# Patient Record
Sex: Male | Born: 2000 | Race: White | Hispanic: No | Marital: Single | Smoking: Never smoker
Health system: Southern US, Community
[De-identification: ages and names within clinical notes are randomized; demographics above are authoritative.]

## PROBLEM LIST (undated history)

## (undated) DIAGNOSIS — F909 Attention-deficit hyperactivity disorder, unspecified type: Secondary | ICD-10-CM

## (undated) HISTORY — PX: ANKLE SURGERY: SHX546

## (undated) HISTORY — PX: INGUINAL HERNIA REPAIR: SHX194

---
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
## 2022-12-05 ENCOUNTER — Ambulatory Visit
Admission: EM | Admit: 2022-12-05 | Discharge: 2022-12-05 | Disposition: A | Discharge status: Home / Self Care | Payer: Medicaid Other

## 2022-12-05 ENCOUNTER — Encounter: Payer: Self-pay | Admitting: Emergency Medicine

## 2022-12-05 DIAGNOSIS — J0100 Acute maxillary sinusitis, unspecified: Secondary | ICD-10-CM

## 2022-12-05 DIAGNOSIS — L0100 Impetigo, unspecified: Secondary | ICD-10-CM

## 2022-12-05 MED ORDER — MUPIROCIN 2 % EX OINT: 1.0000 | TOPICAL_OINTMENT | Freq: Two times a day (BID) | CUTANEOUS | 0 refills | Status: DC

## 2022-12-05 MED ORDER — AMOXICILLIN 875 MG PO TABS: 875.0000 mg | ORAL_TABLET | Freq: Two times a day (BID) | ORAL | 0 refills | Status: AC

## 2022-12-05 NOTE — ED Provider Notes (Signed)
[name]    CSN: [id number] Arrival date & time: 12/05/22  1715      History   Chief Complaint Chief Complaint  Patient presents with   Nasal Congestion   Facial Pain    HPI Cole Jordan is a 22 y.o. male.  Patient presents with 1 month history of sinus pressure, congestion, ear pain, headache, intermittent dizziness.  His symptoms have gotten worse in the last couple of weeks.  He feels that he has a sinus infection.  He also has a sore in the inside of his left nare.  He has been treating his symptoms with OTC cold medicine without relief.  No fever, cough, shortness of breath, or other symptoms.  The history is provided by the patient and medical records.    Past Medical History:  Diagnosis Date   ADHD     There are no problems to display for this patient.   Past Surgical History:  Procedure Laterality Date   INGUINAL HERNIA REPAIR         Home Medications    Prior to Admission medications   Medication Sig Start Date End Date Taking? Authorizing Provider  amoxicillin (AMOXIL) 875 MG tablet Take 1 tablet (875 mg total) by mouth 2 (two) times daily for 10 days. 12/05/22 12/15/22 Yes Mickie Bail, NP  mupirocin ointment (BACTROBAN) 2 % Apply 1 Application topically 2 (two) times daily. 12/05/22  Yes Mickie Bail, NP  traZODone (DESYREL) 50 MG tablet Take 50 mg by mouth at bedtime. 12/03/22  Yes [provider]  cloNIDine (CATAPRES) 0.1 MG tablet TAKE 1 TABLET BY MOUTH EVERY NIGHT AT BEDTIME AS DIRECTED 08/12/21   [provider]  methylphenidate 36 MG PO CR tablet Take 36 mg by mouth daily.    [provider]  methylphenidate 36 MG PO CR tablet Take 36 mg by mouth daily.    [provider]  sertraline (ZOLOFT) 25 MG tablet  10/17/17   [provider]    Family History Family History  Problem Relation Age of Onset   Healthy Mother    Healthy Father     Social History Social History   Tobacco Use    Smoking status: Never   Smokeless tobacco: Never  Vaping Use   Vaping status: Never Used  Substance Use Topics   Alcohol use: No   Drug use: No     Allergies   Patient has no known allergies.   Review of Systems Review of Systems  Constitutional:  Negative for chills and fever.  HENT:  Positive for congestion, ear pain, rhinorrhea and sinus pressure. Negative for sore throat.   Respiratory:  Negative for cough and shortness of breath.   Cardiovascular:  Negative for chest pain and palpitations.  Neurological:  Positive for dizziness and headaches. Negative for syncope, weakness and numbness.     Physical Exam Triage Vital Signs ED Triage Vitals  Encounter Vitals Group     BP --      Systolic BP Percentile --      Diastolic BP Percentile --      Pulse --      Resp --      Temp --      Temp src --      SpO2 --      Weight 12/05/22 1839 150 lb (68 kg)     Height --      Head Circumference --      Peak Flow --  Pain Score 12/05/22 1838 4     Pain Loc --      Pain Education --      Exclude from Growth Chart --    No data found.  Updated Vital Signs BP 125/70   Pulse 72   Temp 98.1 F (36.7 C) (Oral)   Resp 18   Wt 150 lb (68 kg)   SpO2 98%   Visual Acuity Right Eye Distance:   Left Eye Distance:   Bilateral Distance:    Right Eye Near:   Left Eye Near:    Bilateral Near:     Physical Exam Constitutional:      General: He is not in acute distress. HENT:     Right Ear: Tympanic membrane normal.     Left Ear: Tympanic membrane normal.     Nose: Congestion and rhinorrhea present.     Mouth/Throat:     Mouth: Mucous membranes are moist.     Pharynx: Oropharynx is clear.  Cardiovascular:     Rate and Rhythm: Normal rate and regular rhythm.     Heart sounds: Normal heart sounds.  Pulmonary:     Effort: Pulmonary effort is normal. No respiratory distress.     Breath sounds: Normal breath sounds.  Skin:    General: Skin is warm and dry.      Findings: Lesion present.     Comments: Honey-colored crusted lesion just inside left nare; no drainage.  Neurological:     Mental Status: He is alert.      UC Treatments / Results  Labs (all labs ordered are listed, but only abnormal results are displayed) Labs Reviewed - No data to display  EKG   Radiology No results found.  Procedures Procedures (including critical care time)  Medications Ordered in UC Medications - No data to display  Initial Impression / Assessment and Plan / UC Course  I have reviewed the triage vital signs and the nursing notes.  Pertinent labs & imaging results that were available during my care of the patient were reviewed by me and considered in my medical decision making (see chart for details).    Acute sinusitis, impetigo.  Patient is a symptomatic for a month.  Afebrile and VSS.  Treating today with amoxicillin.  Treating nasal sore with mupirocin ointment.  Education provided on sinus infection and impetigo.  Instructed patient to follow-up with his PCP if he is not improving.  He agrees to plan of care.  Final Clinical Impressions(s) / UC Diagnoses   Final diagnoses:  Acute non-recurrent maxillary sinusitis  Impetigo     Discharge Instructions      Take the amoxicillin and use the mupirocin ointment as directed.  Follow-up with your primary care provider if your symptoms are not improving.      ED Prescriptions     Medication Sig Dispense Auth. Provider   amoxicillin (AMOXIL) 875 MG tablet Take 1 tablet (875 mg total) by mouth 2 (two) times daily for 10 days. 20 tablet Mickie Bail, NP   mupirocin ointment (BACTROBAN) 2 % Apply 1 Application topically 2 (two) times daily. 22 g Mickie Bail, NP      PDMP not reviewed this encounter.   Mickie Bail, NP 12/05/22 1900

## 2022-12-05 NOTE — ED Triage Notes (Signed)
Patient in office today chief complaint congestion, Ha and dizziness x 23mo greenish nasal mucus facial pain and ear pain  OTC: Cold and Cough Medicine  Denies: Fever, N/V

## 2022-12-05 NOTE — Discharge Instructions (Addendum)
Take the amoxicillin and use the mupirocin ointment as directed.  Follow-up with your primary care provider if your symptoms are not improving.

## 2023-02-01 ENCOUNTER — Encounter: Payer: Self-pay | Admitting: Family Medicine

## 2023-02-01 ENCOUNTER — Ambulatory Visit (INDEPENDENT_AMBULATORY_CARE_PROVIDER_SITE_OTHER): Payer: Medicaid Other | Admitting: Family Medicine

## 2023-02-01 VITALS — BP 120/78 | HR 74 | Ht 65.0 in | Wt 154.8 lb

## 2023-02-01 DIAGNOSIS — Z13220 Encounter for screening for lipoid disorders: Secondary | ICD-10-CM

## 2023-02-01 DIAGNOSIS — Z131 Encounter for screening for diabetes mellitus: Secondary | ICD-10-CM

## 2023-02-01 DIAGNOSIS — R5382 Chronic fatigue, unspecified: Secondary | ICD-10-CM | POA: Diagnosis not present

## 2023-02-01 DIAGNOSIS — Z0000 Encounter for general adult medical examination without abnormal findings: Secondary | ICD-10-CM

## 2023-02-01 DIAGNOSIS — E559 Vitamin D deficiency, unspecified: Secondary | ICD-10-CM

## 2023-02-01 DIAGNOSIS — G478 Other sleep disorders: Secondary | ICD-10-CM

## 2023-02-01 DIAGNOSIS — N529 Male erectile dysfunction, unspecified: Secondary | ICD-10-CM

## 2023-02-01 DIAGNOSIS — M25571 Pain in right ankle and joints of right foot: Secondary | ICD-10-CM

## 2023-02-01 DIAGNOSIS — F909 Attention-deficit hyperactivity disorder, unspecified type: Secondary | ICD-10-CM

## 2023-02-01 DIAGNOSIS — Z113 Encounter for screening for infections with a predominantly sexual mode of transmission: Secondary | ICD-10-CM

## 2023-02-01 DIAGNOSIS — F418 Other specified anxiety disorders: Secondary | ICD-10-CM

## 2023-02-01 DIAGNOSIS — Z114 Encounter for screening for human immunodeficiency virus [HIV]: Secondary | ICD-10-CM

## 2023-02-01 DIAGNOSIS — G4733 Obstructive sleep apnea (adult) (pediatric): Secondary | ICD-10-CM | POA: Insufficient documentation

## 2023-02-01 DIAGNOSIS — Z1159 Encounter for screening for other viral diseases: Secondary | ICD-10-CM

## 2023-02-01 DIAGNOSIS — G8929 Other chronic pain: Secondary | ICD-10-CM

## 2023-02-01 MED ORDER — SILDENAFIL CITRATE 100 MG PO TABS: 50.0000 mg | ORAL_TABLET | ORAL | 11 refills | Status: DC | PRN

## 2023-02-01 NOTE — Progress Notes (Signed)
 Primary Care / Sports Medicine Office Visit  Patient Information:  Patient ID: Cole Jordan, male DOB: October 11, 2000 Age: 23 y.o. MRN: [id number]   Cole Jordan is a pleasant 23 y.o. male presenting with the following:  Chief Complaint  Patient presents with   Establish Care    Patient presents today to establish care and talk about his low energy level. He has noticed low energy for the last year. He has been eating well and exercise but it has not helped. He would like to get some labs to see if he is deficient in anything.     Vitals:   02/01/23 0819  BP: 120/78  Pulse: 74  SpO2: 99%   Vitals:   02/01/23 0819  Weight: 154 lb 12.8 oz (70.2 kg)  Height: 5' 5 (1.651 m)   Body mass index is 25.76 kg/m.  No results found.   Independent interpretation of notes and tests performed by another provider:   None  Procedures performed:   None  Pertinent History, Exam, Impression, and Recommendations:   Physical Exam General: Well Developed, well nourished, and in no acute distress.  Skin: Warm and dry, no rashes noted.  Cardiac: Regular rate and rhythm, no murmurs, rubs, or gallops. Respiratory: Clear to auscultation bilaterally. Not using accessory muscles, speaking in full sentences.   Problem List Items Addressed This Visit       Nervous and Auditory   Non-restorative sleep   The patient also reports issues with sleep, initially attributing it to a past nasal septum surgery, but notes that sleep has improved in the past couple of months. Despite improved sleep, the patient continues to feel fatigued. He does report snoring.  -Order home sleep study to evaluate for sleep apnea.        Other   ADHD   Chronic fatigue - Primary   Presents with a chief complaint of low energy that has been progressively worsening over the past year or two. The patient reports feeling exhausted after work and sleeping from 6 PM to 7 AM, but still waking up feeling unwell.    Chronic Fatigue Ongoing for the past year, worsening over time. Multiple potential contributing factors including sleep apnea, low testosterone , and mood disorders. -Order comprehensive lab work including thyroid function, blood count, and glucose levels. -Order home sleep study to evaluate for sleep apnea. -Consider further management through psychiatry for potential adjustment of mood disorder treatment.      Relevant Orders   CBC   Comprehensive metabolic panel   TSH   Chronic pain of right ankle   Chronic Ankle Pain -right History of three ankle surgeries in 2023 and 2024. Currently managed with Meloxicam 15mg  daily. -Consider discussing potential addition of Cymbalta (duloxetine) with psychiatry provider to address both mood and chronic pain.      Relevant Medications   meloxicam (MOBIC) 15 MG tablet   buPROPion (WELLBUTRIN XL) 150 MG 24 hr tablet   Depression with anxiety   Relevant Medications   buPROPion (WELLBUTRIN XL) 150 MG 24 hr tablet   Erectile dysfunction   The patient also reports sexual dysfunction. The patient notes no decrease in libido but finding difficulty maintaining an erection, which has been concurrent with the decrease in energy.  Erectile Dysfunction Difficulty maintaining erection despite preserved libido. Possible contributing factors include low testosterone , mood disorders, and potential sleep apnea. -Order Sildenafil  (Viagra ) 50-100 mg PRN to improve erectile function. -Consider further evaluation depending on results of lab  work and sleep study.      Relevant Medications   sildenafil  (VIAGRA ) 100 MG tablet   Other Visit Diagnoses       Healthcare maintenance       Relevant Orders   CBC   Comprehensive metabolic panel   Hemoglobin A1c   Hepatitis C antibody   HIV Antibody (routine testing w rflx)   Lipid panel   TSH   VITAMIN D  25 Hydroxy (Vit-D Deficiency, Fractures)   RPR   GC/Chlamydia Probe Amp   HSV-2 Ab, IgG     Routine  screening for STI (sexually transmitted infection)       Relevant Orders   Hepatitis C antibody   HIV Antibody (routine testing w rflx)   RPR   GC/Chlamydia Probe Amp   HSV-2 Ab, IgG     Screening for HIV (human immunodeficiency virus)       Relevant Orders   HIV Antibody (routine testing w rflx)     Need for hepatitis C screening test       Relevant Orders   Hepatitis C antibody     Screening for lipoid disorders       Relevant Orders   Lipid panel     Vitamin D  deficiency       Relevant Orders   VITAMIN D  25 Hydroxy (Vit-D Deficiency, Fractures)     Screening for diabetes mellitus       Relevant Orders   VITAMIN D  25 Hydroxy (Vit-D Deficiency, Fractures)      I provided a total time of 64 minutes including both face-to-face and non-face-to-face time on 02/01/2023 inclusive of time utilized for medical chart review, information gathering, care coordination with staff, and documentation completion.   Orders & Medications Medications:  Meds ordered this encounter  Medications   sildenafil  (VIAGRA ) 100 MG tablet    Sig: Take 0.5-1 tablets (50-100 mg total) by mouth as needed for erectile dysfunction.    Dispense:  10 tablet    Refill:  11   Orders Placed This Encounter  Procedures   GC/Chlamydia Probe Amp   CBC   Comprehensive metabolic panel   Hemoglobin A1c   Hepatitis C antibody   HIV Antibody (routine testing w rflx)   Lipid panel   TSH   VITAMIN D  25 Hydroxy (Vit-D Deficiency, Fractures)   RPR   HSV-2 Ab, IgG     No follow-ups on file.     Selinda JINNY Ku, MD, [facility]   Primary Care Sports Medicine Primary Care and Sports Medicine at [facility]

## 2023-02-01 NOTE — Assessment & Plan Note (Signed)
 Presents with a chief complaint of low energy that has been progressively worsening over the past year or two. The patient reports feeling exhausted after work and sleeping from 6 PM to 7 AM, but still waking up feeling unwell.   Chronic Fatigue Ongoing for the past year, worsening over time. Multiple potential contributing factors including sleep apnea, low testosterone , and mood disorders. -Order comprehensive lab work including thyroid function, blood count, and glucose levels. -Order home sleep study to evaluate for sleep apnea. -Consider further management through psychiatry for potential adjustment of mood disorder treatment.

## 2023-02-01 NOTE — Assessment & Plan Note (Signed)
 The patient also reports sexual dysfunction. The patient notes no decrease in libido but finding difficulty maintaining an erection, which has been concurrent with the decrease in energy.  Erectile Dysfunction Difficulty maintaining erection despite preserved libido. Possible contributing factors include low testosterone , mood disorders, and potential sleep apnea. -Order Sildenafil  (Viagra ) 50-100 mg PRN to improve erectile function. -Consider further evaluation depending on results of lab work and sleep study.

## 2023-02-01 NOTE — Patient Instructions (Signed)
 Patient Plan:  Chronic Fatigue: - We will conduct comprehensive lab work. - A home sleep study will be arranged to assess for sleep apnea, as this could contribute to your symptoms.  Erectile Dysfunction: - To address erectile dysfunction, we will prescribe Sildenafil  (Viagra ), take 50-100 mg (1/2-1 tablet) on an as-needed basis.    History of Depression/Anxiety: - You are currently taking Wellbutrin 150 mg daily for depression and anxiety. - We may discuss the possibility of adding Cymbalta (duloxetine, targeting 60 mg) with your psychiatry provider to address both mood symptoms and chronic pain.  Chronic Ankle Pain: - You continue to experience chronic ankle pain, likely due to previous surgeries, and are currently taking Meloxicam 15 mg daily. - Similar to the mood disorder plan, we may discuss adding Cymbalta (duloxetine) with your psychiatry provider for additional pain management benefits.  General Health Maintenance: - We will schedule your annual physical in three months.  Please contact our office with any questions or concerns regarding your treatment plan.

## 2023-02-01 NOTE — Assessment & Plan Note (Signed)
 Chronic Ankle Pain -right History of three ankle surgeries in 2023 and 2024. Currently managed with Meloxicam 15mg  daily. -Consider discussing potential addition of Cymbalta (duloxetine) with psychiatry provider to address both mood and chronic pain.

## 2023-02-01 NOTE — Assessment & Plan Note (Signed)
 The patient also reports issues with sleep, initially attributing it to a past nasal septum surgery, but notes that sleep has improved in the past couple of months. Despite improved sleep, the patient continues to feel fatigued. He does report snoring.  -Order home sleep study to evaluate for sleep apnea.

## 2023-02-01 NOTE — Assessment & Plan Note (Signed)
 History of Depression/Anxiety Previously treated with Sertraline, Trazodone, and Clonidine. Currently managed with Wellbutrin 150mg  daily. -Consider discussing potential addition of Cymbalta (duloxetine) with psychiatry provider to address both mood and chronic pain.

## 2023-02-06 ENCOUNTER — Other Ambulatory Visit: Payer: Self-pay | Admitting: Family Medicine

## 2023-02-06 DIAGNOSIS — R5382 Chronic fatigue, unspecified: Secondary | ICD-10-CM

## 2023-02-06 DIAGNOSIS — G478 Other sleep disorders: Secondary | ICD-10-CM

## 2023-02-06 LAB — TSH: TSH: 2.43 u[IU]/mL (ref 0.450–4.500)

## 2023-02-06 LAB — CBC
Hematocrit: 46.2 % (ref 37.5–51.0)
Hemoglobin: 15.3 g/dL (ref 13.0–17.7)
MCH: 28.9 pg (ref 26.6–33.0)
MCHC: 33.1 g/dL (ref 31.5–35.7)
MCV: 87 fL (ref 79–97)
Platelets: 321 10*3/uL (ref 150–450)
RBC: 5.29 x10E6/uL (ref 4.14–5.80)
RDW: 11.9 % (ref 11.6–15.4)
WBC: 6.6 10*3/uL (ref 3.4–10.8)

## 2023-02-06 LAB — COMPREHENSIVE METABOLIC PANEL
ALT: 49 [IU]/L — ABNORMAL HIGH (ref 0–44)
AST: 28 [IU]/L (ref 0–40)
Albumin: 4.8 g/dL (ref 4.3–5.2)
Alkaline Phosphatase: 85 [IU]/L (ref 51–125)
BUN/Creatinine Ratio: 12 (ref 9–20)
BUN: 13 mg/dL (ref 6–20)
Bilirubin Total: 0.6 mg/dL (ref 0.0–1.2)
CO2: 24 mmol/L (ref 20–29)
Calcium: 9.7 mg/dL (ref 8.7–10.2)
Chloride: 103 mmol/L (ref 96–106)
Creatinine, Ser: 1.05 mg/dL (ref 0.76–1.27)
Globulin, Total: 2.4 g/dL (ref 1.5–4.5)
Glucose: 99 mg/dL (ref 70–99)
Potassium: 4.4 mmol/L (ref 3.5–5.2)
Sodium: 142 mmol/L (ref 134–144)
Total Protein: 7.2 g/dL (ref 6.0–8.5)
eGFR: 104 mL/min/{1.73_m2} (ref >59)

## 2023-02-06 LAB — HEMOGLOBIN A1C
Est. average glucose Bld gHb Est-mCnc: 108 mg/dL
Hgb A1c MFr Bld: 5.4 % (ref 4.8–5.6)

## 2023-02-06 LAB — LIPID PANEL
Chol/HDL Ratio: 3.5 {ratio} (ref 0.0–5.0)
Cholesterol, Total: 183 mg/dL (ref 100–199)
HDL: 52 mg/dL (ref >39)
LDL Chol Calc (NIH): 114 mg/dL — ABNORMAL HIGH (ref 0–99)
Triglycerides: 93 mg/dL (ref 0–149)
VLDL Cholesterol Cal: 17 mg/dL (ref 5–40)

## 2023-02-06 LAB — VITAMIN D 25 HYDROXY (VIT D DEFICIENCY, FRACTURES): Vit D, 25-Hydroxy: 37.8 ng/mL (ref 30.0–100.0)

## 2023-02-06 LAB — HEPATITIS C ANTIBODY: Hep C Virus Ab: NONREACTIVE

## 2023-02-06 LAB — RPR: RPR Ser Ql: NONREACTIVE

## 2023-02-06 LAB — HIV ANTIBODY (ROUTINE TESTING W REFLEX): HIV Screen 4th Generation wRfx: NONREACTIVE

## 2023-02-06 LAB — HSV-2 AB, IGG: HSV 2 IgG, Type Spec: NONREACTIVE

## 2023-02-07 ENCOUNTER — Encounter: Payer: Self-pay | Admitting: Family Medicine

## 2023-02-07 LAB — GC/CHLAMYDIA PROBE AMP
Chlamydia trachomatis, NAA: NEGATIVE
Neisseria Gonorrhoeae by PCR: NEGATIVE

## 2023-02-07 LAB — TESTOSTERONE: Testosterone: 480 ng/dL (ref 264–916)

## 2023-02-07 LAB — SPECIMEN STATUS REPORT

## 2023-02-07 NOTE — Telephone Encounter (Signed)
 Pt response.  KP

## 2023-02-07 NOTE — Telephone Encounter (Signed)
 Please review.  KP

## 2023-02-08 ENCOUNTER — Other Ambulatory Visit: Payer: Self-pay

## 2023-02-08 DIAGNOSIS — R5383 Other fatigue: Secondary | ICD-10-CM

## 2023-02-13 ENCOUNTER — Ambulatory Visit: Payer: Medicaid Other | Admitting: Internal Medicine

## 2023-02-13 ENCOUNTER — Encounter: Payer: Self-pay | Admitting: Internal Medicine

## 2023-02-13 VITALS — BP 128/90 | HR 72 | Temp 97.8°F | Ht 65.0 in | Wt 157.0 lb

## 2023-02-13 DIAGNOSIS — R0683 Snoring: Secondary | ICD-10-CM | POA: Diagnosis not present

## 2023-02-13 DIAGNOSIS — G4719 Other hypersomnia: Secondary | ICD-10-CM | POA: Diagnosis not present

## 2023-02-13 NOTE — Progress Notes (Signed)
 Name: Cole Jordan MRN: [id number] DOB: February 28, 2000    CHIEF COMPLAINT:  EXCESSIVE DAYTIME SLEEPINESS ASSESSMENT OF SLEEP APNEA   HISTORY OF PRESENT ILLNESS: Patient has been having excessive daytime sleepiness for a long time Patient has been having extreme fatigue and tiredness, lack of energy +  very Loud snoring every night + struggling breathe at night and gasps for air + morning headaches + Nonrefreshing sleep  Discussed sleep data and reviewed with patient.  Encouraged proper weight management.  Discussed driving precautions and its relationship with hypersomnolence.  Discussed operating dangerous equipment and its relationship with hypersomnolence.  Discussed sleep hygiene, and benefits of a fixed sleep waked time.  The importance of getting eight or more hours of sleep discussed with patient.  Discussed limiting the use of the computer and television before bedtime.  Decrease naps during the day, so night time sleep will become enhanced.  Limit caffeine, and sleep deprivation.  HTN, stroke, and heart failure are potential risk factors.     EPWORTH SLEEP SCORE 20 Patient with multiple surgeries in the past Right ankle 3 Left arm surgery ENT surgery for deviated septum  Non-smoker Nonalcoholic No illicit drug use  Works as a Nutritional therapist   PAST MEDICAL HISTORY :   has a past medical history of ADHD and Depression with anxiety (2020).  has a past surgical history that includes Inguinal hernia repair; Ankle surgery (Right); and Nasal septum surgery (2023). Prior to Admission medications   Medication Sig Start Date End Date Taking? Authorizing Provider  buPROPion (WELLBUTRIN XL) 150 MG 24 hr tablet Take 150 mg by mouth daily.    [provider]  meloxicam (MOBIC) 15 MG tablet Take 15 mg by mouth daily.    [provider]  sildenafil  (VIAGRA ) 100 MG tablet Take 0.5-1 tablets (50-100 mg total) by mouth as needed for erectile dysfunction. 02/01/23  02/01/24  Ma Saupe, MD   No Known Allergies  FAMILY HISTORY:  family history includes Depression in his father and mother; Diabetes in his father; Healthy in his father and mother. SOCIAL HISTORY:  reports that he has never smoked. He has never used smokeless tobacco. He reports that he does not drink alcohol and does not use drugs.   Review of Systems:  Gen:  Denies  fever, sweats, chills weight loss generalized fatigue HEENT: Denies blurred vision, double vision, ear pain, eye pain, hearing loss, nose bleeds, sore throat Cardiac:  No dizziness, chest pain or heaviness, chest tightness,edema, No JVD Resp:   No cough, -sputum production, -shortness of breath,-wheezing, -hemoptysis,  Gi: Denies swallowing difficulty, stomach pain, nausea or vomiting, diarrhea, constipation, bowel incontinence Gu:  Denies bladder incontinence, burning urine Ext:   Denies Joint pain, stiffness or swelling Skin: Denies  skin rash, easy bruising or bleeding or hives Endoc:  Denies polyuria, polydipsia , polyphagia or weight change Psych:   Denies depression, insomnia or hallucinations  Other:  All other systems negative   ALL OTHER ROS ARE NEGATIVE   BP (!) 128/90 (BP Location: Left Arm, Patient Position: Sitting, Cuff Size: Normal)   Pulse 72   Temp 97.8 F (36.6 C) (Temporal)   Ht 5\' 5"  (1.651 m)   Wt 157 lb (71.2 kg)   SpO2 99%   BMI 26.13 kg/m     Physical Examination:   General Appearance: No distress  EYES PERRLA, EOM intact.   NECK Supple, No JVD Pulmonary: normal breath sounds, No wheezing.  CardiovascularNormal S1,S2.  No m/r/g.  Abdomen: Benign, Soft, non-tender. Skin:   warm, no rashes, no ecchymosis  Extremities: normal, no cyanosis, clubbing. Neuro:without focal findings,  speech normal  PSYCHIATRIC: Mood, affect within normal limits.   ALL OTHER ROS ARE NEGATIVE  Chest x-ray 2021 Independently reviewed by me today No acute process No pneumonia no  effusions  CBC    Component Value Date/Time   WBC 6.6 02/05/2023 0818   RBC 5.29 02/05/2023 0818   HGB 15.3 02/05/2023 0818   HCT 46.2 02/05/2023 0818   PLT 321 02/05/2023 0818   MCV 87 02/05/2023 0818   MCH 28.9 02/05/2023 0818   MCHC 33.1 02/05/2023 0818   RDW 11.9 02/05/2023 0818       Latest Ref Rng & Units 02/05/2023    8:18 AM  BMP  Glucose 70 - 99 mg/dL 99   BUN 6 - 20 mg/dL 13   Creatinine 1.47 - 1.27 mg/dL 8.29   BUN/Creat Ratio 9 - 20 12   Sodium 134 - 144 mmol/L 142   Potassium 3.5 - 5.2 mmol/L 4.4   Chloride 96 - 106 mmol/L 103   CO2 20 - 29 mmol/L 24   Calcium 8.7 - 10.2 mg/dL 9.7       ASSESSMENT AND PLAN SYNOPSIS  23 year old pleasant white male seen today for patient with signs and symptoms of excessive daytime sleepiness with probable underlying diagnosis of obstructive sleep apnea Patient previously weighed 135 pounds now weighs 156 pounds   Recommend Sleep Study for definitve diagnosis Recommend home sleep test to assess for sleep apnea  Although patient does not meet overweight or obesity criteria he has gained a significant amount of weight over the last 6 months at the increase of 15 to 20 pounds Recommend losing weight to go back to baseline  Be aware of reduced alertness and do not drive or operate heavy machinery if experiencing this or drowsiness.  Exercise encouraged, as tolerated. Encouraged proper weight management.  Important to get eight or more hours of sleep  Limiting the use of the computer and television before bedtime.  Decrease naps during the day, so night time sleep will become enhanced.  Limit caffeine, and sleep deprivation.     MEDICATION ADJUSTMENTS/LABS AND TESTS ORDERED: Recommend Sleep Study Avoid Allergens and Irritants Avoid secondhand smoke Avoid SICK contacts Recommend  Masking  when appropriate Recommend Keep up-to-date with vaccinations    CURRENT MEDICATIONS REVIEWED AT LENGTH WITH PATIENT  TODAY   Patient  satisfied with Plan of action and management. All questions answered  Follow up  3 months  Total Time Spent  65 mins   Lady Pier, M.D.  [facility]  Medical Director [facility] Medical Director [facility] Cardio-Pulmonary Department

## 2023-02-13 NOTE — Patient Instructions (Signed)
 Recommend Home SLeep Test to assess for sleep apnea  Avoid Allergens and Irritants Avoid secondhand smoke Avoid SICK contacts Recommend  Masking  when appropriate Recommend Keep up-to-date with vaccinations

## 2023-02-19 ENCOUNTER — Encounter: Payer: Self-pay | Admitting: Internal Medicine

## 2023-02-19 NOTE — Telephone Encounter (Signed)
I have spoke with Mr. Easterly and he has been scheduled to pick up HST machine on 02/20/23 @ 1:30pm

## 2023-02-20 ENCOUNTER — Ambulatory Visit: Payer: Medicaid Other

## 2023-02-20 DIAGNOSIS — G4719 Other hypersomnia: Secondary | ICD-10-CM

## 2023-02-20 DIAGNOSIS — R0683 Snoring: Secondary | ICD-10-CM

## 2023-02-28 ENCOUNTER — Encounter: Payer: Self-pay | Admitting: Family Medicine

## 2023-03-11 ENCOUNTER — Telehealth: Payer: Self-pay | Admitting: Internal Medicine

## 2023-03-11 NOTE — Telephone Encounter (Signed)
 See patient message from 02/19/2023.   Closing this encounter.

## 2023-03-11 NOTE — Telephone Encounter (Signed)
 Pt saw hst results online and is asking for clarification on them

## 2023-03-11 NOTE — Telephone Encounter (Signed)
 HST has been scanned into his chart.

## 2023-03-21 NOTE — Telephone Encounter (Signed)
 I spoke with the patient and went over the HST results with him. I did move his follow up appt up to 3/3. He is asking if there is anything he can do for his fatigue and lack of energy until his appt on 3/3?

## 2023-04-01 ENCOUNTER — Encounter: Payer: Self-pay | Admitting: Internal Medicine

## 2023-04-01 ENCOUNTER — Ambulatory Visit (INDEPENDENT_AMBULATORY_CARE_PROVIDER_SITE_OTHER): Payer: Medicaid Other | Admitting: Internal Medicine

## 2023-04-01 VITALS — BP 106/60 | HR 60 | Temp 98.0°F | Ht 65.0 in | Wt 146.6 lb

## 2023-04-01 DIAGNOSIS — G4733 Obstructive sleep apnea (adult) (pediatric): Secondary | ICD-10-CM | POA: Diagnosis not present

## 2023-04-01 DIAGNOSIS — R5382 Chronic fatigue, unspecified: Secondary | ICD-10-CM | POA: Diagnosis not present

## 2023-04-01 NOTE — Patient Instructions (Addendum)
 Total time spent Patient Instructions  Continue to use CPAP every night, minimum of 4-6 hours a night.  Change equipment every 30 days or as directed by DME.  Wash your tubing with warm soap and water daily, hang to dry. Wash humidifier portion weekly. Use bottled, distilled water and change daily   Be aware of reduced alertness and do not drive or operate heavy machinery if experiencing this or drowsiness.  Exercise encouraged, as tolerated. Encouraged proper weight management.  Important to get eight or more hours of sleep  Limiting the use of the computer and television before bedtime.  Decrease naps during the day, so night time sleep will become enhanced.  Limit caffeine, and sleep deprivation.    Avoid Allergens and Irritants Avoid secondhand smoke Avoid SICK contacts Recommend  Masking  when appropriate Recommend Keep up-to-date with vaccinations

## 2023-04-01 NOTE — Progress Notes (Signed)
 Name: Cole Jordan MRN: [id number] DOB: [birth year]    CHIEF COMPLAINT:  Follow-up assessment for OSA Follow-up assessment for daytime sleepiness   HISTORY OF PRESENT ILLNESS: Home sleep study obtained a month ago AHI is 5.5 Lowest O2 sats was 81% Left lateral position AHI 6.7 Right lateral position AHI 5.9  I discussed several options with patient   Discussed sleep data and reviewed with patient.  Encouraged proper weight management.  Discussed driving precautions and its relationship with hypersomnolence.  Discussed operating dangerous equipment and its relationship with hypersomnolence.  Discussed sleep hygiene, and benefits of a fixed sleep waked time.  The importance of getting eight or more hours of sleep discussed with patient.  Discussed limiting the use of the computer and television before bedtime.  Decrease naps during the day, so night time sleep will become enhanced.  Limit caffeine, and sleep deprivation.  HTN, stroke, and heart failure are potential risk factors.    Patient with multiple surgeries in the past Right ankle 3 Left arm surgery ENT surgery for deviated septum  Non-smoker Nonalcoholic No illicit drug use  Works as a Nutritional therapist   PAST MEDICAL HISTORY :   has a past medical history of ADHD and Depression with anxiety (2020).  has a past surgical history that includes Inguinal hernia repair; Ankle surgery (Right); and Nasal septum surgery (2023). Prior to Admission medications   Medication Sig Start Date End Date Taking? Authorizing Provider  buPROPion (WELLBUTRIN XL) 150 MG 24 hr tablet Take 150 mg by mouth daily.    [provider]  meloxicam (MOBIC) 15 MG tablet Take 15 mg by mouth daily.    [provider]  sildenafil (VIAGRA) 100 MG tablet Take 0.5-1 tablets (50-100 mg total) by mouth as needed for erectile dysfunction. 02/01/23 02/01/24  Jerrol Banana, MD   No Known Allergies  FAMILY HISTORY:  family history  includes Depression in his father and mother; Diabetes in his father; Healthy in his father and mother. SOCIAL HISTORY:  reports that he has never smoked. He has never used smokeless tobacco. He reports that he does not drink alcohol and does not use drugs.      Review of Systems: Gen:  Denies  fever, sweats, chills weight loss  HEENT: Denies blurred vision, double vision, ear pain, eye pain, hearing loss, nose bleeds, sore throat Cardiac:  No dizziness, chest pain or heaviness, chest tightness,edema, No JVD Resp:   No cough, -sputum production, -shortness of breath,-wheezing, -hemoptysis,  Other:  All other systems negative   Physical Examination:   General Appearance: No distress  EYES PERRLA, EOM intact.   NECK Supple, No JVD Pulmonary: normal breath sounds, No wheezing.  CardiovascularNormal S1,S2.  No m/r/g.   Abdomen: Benign, Soft, non-tender. Neurology UE/LE 5/5 strength, no focal deficits Ext pulses intact, cap refill intact ALL OTHER ROS ARE NEGATIVE   Chest x-ray 2021 No acute process No pneumonia no effusions  CBC    Component Value Date/Time   WBC 6.6 02/05/2023 0818   RBC 5.29 02/05/2023 0818   HGB 15.3 02/05/2023 0818   HCT 46.2 02/05/2023 0818   PLT 321 02/05/2023 0818   MCV 87 02/05/2023 0818   MCH 28.9 02/05/2023 0818   MCHC 33.1 02/05/2023 0818   RDW 11.9 02/05/2023 0818       Latest Ref Rng & Units 02/05/2023    8:18 AM  BMP  Glucose 70 - 99 mg/dL 99   BUN 6 - 20 mg/dL 13  Creatinine 0.76 - 1.27 mg/dL 8.65   BUN/Creat Ratio 9 - 20 12   Sodium 134 - 144 mmol/L 142   Potassium 3.5 - 5.2 mmol/L 4.4   Chloride 96 - 106 mmol/L 103   CO2 20 - 29 mmol/L 24   Calcium 8.7 - 10.2 mg/dL 9.7       ASSESSMENT AND PLAN SYNOPSIS  23 year old pleasant white male seen today for follow-up assessment for excessive daytime sleepiness with a home sleep study that suggests an AHI of 5.5 AHI was 6.7 left lateral position AHI was 5.9 and right lateral  position, lowest O2 saturation recorded was 81%, O2 sat less than 89% was 7.2 minutes    Assessment of OSA Based on his significant excessive daytime sleepiness and fatigue with an AHI of 6.7 and is left lateral position with hypoxia for 7-1/2 minutes less than 89% Recommend prescription for CPAP therapy with auto CPAP 4-10 Referral to DME company [id number]  Patient Instructions  Continue to use CPAP every night, minimum of 4-6 hours a night.  Change equipment every 30 days or as directed by DME.  Wash your tubing with warm soap and water daily, hang to dry. Wash humidifier portion weekly. Use bottled, distilled water and change daily   Be aware of reduced alertness and do not drive or operate heavy machinery if experiencing this or drowsiness.  Exercise encouraged, as tolerated. Encouraged proper weight management.  Important to get eight or more hours of sleep  Limiting the use of the computer and television before bedtime.  Decrease naps during the day, so night time sleep will become enhanced.  Limit caffeine, and sleep deprivation.  HTN, stroke, uncontrolled diabetes and heart failure are potential risk factors.  Risk of untreated sleep apnea including cardiac arrhthymias, stroke, DM, pulm HTN.    Although patient does not meet overweight or obesity criteria he has gained a significant amount of weight over the last 6 months at the increase of 15 to 20 pounds Recommend losing weight to go back to baseline    MEDICATION ADJUSTMENTS/LABS AND TESTS ORDERED: Referral to DME company [id number] Recommend Starting Auto CPAP 4-10 cm h20 Avoid Allergens and Irritants Avoid secondhand smoke Avoid SICK contacts Recommend  Masking  when appropriate Recommend Keep up-to-date with vaccinations   CURRENT MEDICATIONS REVIEWED AT LENGTH WITH PATIENT TODAY   Patient  satisfied with Plan of action and management. All questions  answered  Follow up 3 months  Total time spent 42 minutes   Lucie Leather, M.D.  [facility]  Medical Director [facility] Medical Director [facility] Cardio-Pulmonary Department

## 2023-04-08 ENCOUNTER — Encounter: Payer: Self-pay | Admitting: Internal Medicine

## 2023-04-10 ENCOUNTER — Ambulatory Visit: Payer: Medicaid Other | Admitting: Internal Medicine

## 2023-04-19 DIAGNOSIS — S36039A Unspecified laceration of spleen, initial encounter: Secondary | ICD-10-CM

## 2023-04-19 HISTORY — DX: Unspecified laceration of spleen, initial encounter: S36.039A

## 2023-05-02 ENCOUNTER — Encounter: Payer: Self-pay | Admitting: Family Medicine

## 2023-05-20 HISTORY — PX: OTHER SURGICAL HISTORY: SHX169

## 2023-06-11 ENCOUNTER — Telehealth: Payer: Self-pay | Admitting: Internal Medicine

## 2023-06-11 NOTE — Telephone Encounter (Signed)
 Patient needs a CPAP compliance appointment. Occur between 06/29/2023 thru 08/27/2023.

## 2023-07-09 NOTE — Telephone Encounter (Signed)
 Called patient twice to schedule CPAP compliance.

## 2023-10-24 ENCOUNTER — Ambulatory Visit (INDEPENDENT_AMBULATORY_CARE_PROVIDER_SITE_OTHER): Admitting: Family Medicine

## 2023-10-24 VITALS — BP 120/60 | HR 85 | Ht 65.0 in | Wt 151.0 lb

## 2023-10-24 DIAGNOSIS — G4733 Obstructive sleep apnea (adult) (pediatric): Secondary | ICD-10-CM

## 2023-10-24 DIAGNOSIS — E291 Testicular hypofunction: Secondary | ICD-10-CM | POA: Insufficient documentation

## 2023-10-24 DIAGNOSIS — F909 Attention-deficit hyperactivity disorder, unspecified type: Secondary | ICD-10-CM

## 2023-10-24 DIAGNOSIS — F418 Other specified anxiety disorders: Secondary | ICD-10-CM

## 2023-10-24 DIAGNOSIS — N529 Male erectile dysfunction, unspecified: Secondary | ICD-10-CM

## 2023-10-24 MED ORDER — TADALAFIL 5 MG PO TABS: 5.0000 mg | ORAL_TABLET | Freq: Every day | ORAL | 11 refills | Status: DC

## 2023-10-24 NOTE — Assessment & Plan Note (Signed)
 Erectile dysfunction and sexual dysfunction - Erectile dysfunction for approximately one year - Partial response to sildenafil ; tadalafil  slightly more effective but still suboptimal - Difficulty achieving erections even with stimulation from girlfriend - Previously able to achieve erections more easily and frequently - Altered ability to climax: either climaxes too quickly or loses erection before climaxing

## 2023-10-24 NOTE — Assessment & Plan Note (Signed)
 Attention-deficit hyperactivity disorder (ADHD) ADHD confirmed. Long-term Concerta use and recent Adderall initiation effective without adverse effects.

## 2023-10-24 NOTE — Progress Notes (Signed)
 Primary Care / Sports Medicine Office Visit  Patient Information:  Patient ID: Cole Jordan, male DOB: 08/15/2000 Age: 23 y.o. MRN: [id number]   Cole Jordan is a pleasant 23 y.o. male presenting with the following:  Chief Complaint  Patient presents with   testosterone     Patient would like to discuss testosterone  supplements and symptoms.    Vitals:   10/24/23 1631  BP: 120/60  Pulse: 85  SpO2: 97%   Vitals:   10/24/23 1631  Weight: 151 lb (68.5 kg)  Height: 5' 5 (1.651 m)   Body mass index is 25.13 kg/m.  No results found.   Discussed the use of AI scribe software for clinical note transcription with the patient, who gave verbal consent to proceed.   Independent interpretation of notes and tests performed by another provider:   None  Procedures performed:   None  Pertinent History, Exam, Impression, and Recommendations:   Problem List Items Addressed This Visit     ADHD   Attention-deficit hyperactivity disorder (ADHD) ADHD confirmed. Long-term Concerta use and recent Adderall initiation effective without adverse effects.      Depression with anxiety   Neuropsychiatric history and medication trials - History of ADHD, previously on Concerta for ten years - Recently prescribed Adderall, which is calming - Trials of Lexapro, sertraline, Wellbutrin, and modafinil without improvement in symptoms  1. Have there been at least 6 different periods of time (at least 2 weeks) when you felt deeply depressed? Yes  2. Did you have problems with depression before the age of 70? Yes  3. Have you ever had to stop or change your antidepressant because it made you highly irritable or hyper? No  4. Have you ever had a period of at least 1 week during which you were more talkative than normal with thoughts racing in your head? Yes  5. Have you ever had a period of at least 1 week during which you felt any of the following: unusually happy; unusually outgoing; or  unusually energetic? Yes  6. Have you ever had a period of at least 1 week during which you needed much less sleep than usual? Yes  "YES" responses to 4 or more of the 6 items is considered a positive screen providing high confidence for BP-I, with an estimated 88% sensitivity, 80% specificity, and 84% accuracy.  YES" to 3 or more of the 6 items also suggests a higher likelihood of BP-I than MDD with an estimated 97% sensitivity, 59% specificity, and 77% accuracy.  Possible bipolar disorder (screen positive) Screening positive for bipolar disorder, symptoms suggestive of bipolar type I. Current SSRI treatment may be ineffective if bipolar disorder is confirmed. - Advise follow-up with psychiatrist for further evaluation and management. - Discussed importance of psychiatric evaluation to confirm diagnosis and adjust treatment.      Erectile dysfunction - Primary   Erectile dysfunction and sexual dysfunction - Erectile dysfunction for approximately one year - Partial response to sildenafil ; tadalafil  slightly more effective but still suboptimal - Difficulty achieving erections even with stimulation from girlfriend - Previously able to achieve erections more easily and frequently - Altered ability to climax: either climaxes too quickly or loses erection before climaxing      Relevant Medications   tadalafil  (CIALIS ) 5 MG tablet   Other Relevant Orders   CBC   Comprehensive Metabolic Panel (CMET)   FSH/LH   Prolactin   TestT+TestF+SHBG   Hypogonadism in male   Hypogonadal symptoms -  Reduced growth of pubic hair over the past year - Shrinking testicles - Decreased facial hair - Testosterone  level in January was normal  Erectile dysfunction with symptoms suggestive of hypogonadism Erectile dysfunction persists with hypogonadal symptoms. Previous PDE5 inhibitors provided partial relief. Normal testosterone  levels previously, further hormonal evaluation needed. - Order repeat  testosterone  and additional hormone panel tests. - Prescribe daily tadalafil . - Refer to urology for further evaluation and potential clomiphene therapy. - Discussed risks of testosterone  supplementation adverse effects without low levels. - Clomiphene discussed as potential treatment to stimulate endogenous testosterone . - Explained tadalafil 's mechanism.      Relevant Orders   CBC   Comprehensive Metabolic Panel (CMET)   FSH/LH   Prolactin   TestT+TestF+SHBG   OSA on CPAP   Sleep disturbance and fatigue - Underwent sleep apnea testing - Uses a CPAP, resulting in slight improvement in fatigue - Persistent symptoms despite use        Orders & Medications Medications:  Meds ordered this encounter  Medications   tadalafil  (CIALIS ) 5 MG tablet    Sig: Take 1 tablet (5 mg total) by mouth daily.    Dispense:  30 tablet    Refill:  11   Orders Placed This Encounter  Procedures   CBC   Comprehensive Metabolic Panel (CMET)   FSH/LH   Prolactin   TestT+TestF+SHBG     No follow-ups on file.     Selinda JINNY Ku, MD, [facility]   Primary Care Sports Medicine Primary Care and Sports Medicine at [facility]

## 2023-10-24 NOTE — Assessment & Plan Note (Signed)
 Sleep disturbance and fatigue - Underwent sleep apnea testing - Uses a CPAP, resulting in slight improvement in fatigue - Persistent symptoms despite use

## 2023-10-24 NOTE — Assessment & Plan Note (Addendum)
 Hypogonadal symptoms - Reduced growth of pubic hair over the past year - Shrinking testicles - Decreased facial hair - Testosterone  level in January was normal   Erectile dysfunction with symptoms suggestive of hypogonadism Erectile dysfunction persists with hypogonadal symptoms. Previous PDE5 inhibitors provided partial relief. Normal testosterone  levels previously, further hormonal evaluation needed. - Order repeat testosterone  and additional hormone panel tests. - Prescribe daily tadalafil . - Refer to urology for further evaluation and potential clomiphene therapy. - Discussed risks of testosterone  supplementation adverse effects without low levels. - Clomiphene discussed as potential treatment to stimulate endogenous testosterone . - Explained tadalafil 's mechanism.

## 2023-10-24 NOTE — Assessment & Plan Note (Addendum)
 Neuropsychiatric history and medication trials - History of ADHD, previously on Concerta for ten years - Recently prescribed Adderall, which is calming - Trials of Lexapro, sertraline, Wellbutrin, and modafinil without improvement in symptoms  1. Have there been at least 6 different periods of time (at least 2 weeks) when you felt deeply depressed? Yes  2. Did you have problems with depression before the age of 43? Yes  3. Have you ever had to stop or change your antidepressant because it made you highly irritable or hyper? No  4. Have you ever had a period of at least 1 week during which you were more talkative than normal with thoughts racing in your head? Yes  5. Have you ever had a period of at least 1 week during which you felt any of the following: unusually happy; unusually outgoing; or unusually energetic? Yes  6. Have you ever had a period of at least 1 week during which you needed much less sleep than usual? Yes  "YES" responses to 4 or more of the 6 items is considered a positive screen providing high confidence for BP-I, with an estimated 88% sensitivity, 80% specificity, and 84% accuracy.  YES" to 3 or more of the 6 items also suggests a higher likelihood of BP-I than MDD with an estimated 97% sensitivity, 59% specificity, and 77% accuracy.  Possible bipolar disorder (screen positive) Screening positive for bipolar disorder, symptoms suggestive of bipolar type I. Current SSRI treatment may be ineffective if bipolar disorder is confirmed. - Advise follow-up with psychiatrist for further evaluation and management. - Discussed importance of psychiatric evaluation to confirm diagnosis and adjust treatment.

## 2023-10-24 NOTE — Patient Instructions (Signed)
 VISIT SUMMARY:  You visited us  today to discuss your ongoing issues with erectile dysfunction, potential hypogonadism, and other related symptoms. We also reviewed your mental health, including possible bipolar disorder and ADHD.  YOUR PLAN:  ERECTILE DYSFUNCTION WITH SYMPTOMS SUGGESTIVE OF HYPOGONADISM: You have been experiencing erectile dysfunction for about a year, along with symptoms that may suggest low testosterone  levels, despite normal levels in January. -We will order repeat testosterone  and additional hormone panel tests. -You are prescribed daily tadalafil . -We will refer you to a urologist for further evaluation and potential clomiphene therapy. -We discussed the risks of testosterone  supplementation, including dependency and adverse effects. -Clomiphene was discussed as a potential treatment to stimulate your body's own testosterone  production. -We explained how tadalafil  works to improve blood flow and its low risk of dependency.  POSSIBLE BIPOLAR DISORDER: You screened positive for bipolar disorder, which may explain some of your symptoms. Your prior SSRI treatment may not be effective if you have bipolar disorder. -We advise you to follow up with a psychiatrist for further evaluation and management. -We discussed the importance of a psychiatric evaluation to confirm the diagnosis and adjust your treatment.  ATTENTION-DEFICIT HYPERACTIVITY DISORDER (ADHD): Your ADHD is confirmed, and you have been effectively managing it with long-term Concerta use and recent Adderall initiation without adverse effects. -Continue your current ADHD medications as they are effective and well-tolerated.

## 2023-11-01 ENCOUNTER — Ambulatory Visit: Payer: Self-pay | Admitting: Family Medicine

## 2023-11-01 DIAGNOSIS — R7989 Other specified abnormal findings of blood chemistry: Secondary | ICD-10-CM

## 2023-11-01 LAB — COMPREHENSIVE METABOLIC PANEL WITH GFR
ALT: 25 IU/L (ref 0–44)
AST: 22 IU/L (ref 0–40)
Albumin: 4.9 g/dL (ref 4.3–5.2)
Alkaline Phosphatase: 85 IU/L (ref 51–125)
BUN/Creatinine Ratio: 14 (ref 9–20)
BUN: 13 mg/dL (ref 6–20)
Bilirubin Total: 0.8 mg/dL (ref 0.0–1.2)
CO2: 24 mmol/L (ref 20–29)
Calcium: 10 mg/dL (ref 8.7–10.2)
Chloride: 100 mmol/L (ref 96–106)
Creatinine, Ser: 0.96 mg/dL (ref 0.76–1.27)
Globulin, Total: 2.2 g/dL (ref 1.5–4.5)
Glucose: 97 mg/dL (ref 70–99)
Potassium: 4.4 mmol/L (ref 3.5–5.2)
Sodium: 140 mmol/L (ref 134–144)
Total Protein: 7.1 g/dL (ref 6.0–8.5)
eGFR: 116 mL/min/1.73 (ref >59)

## 2023-11-01 LAB — CBC
Hematocrit: 49.7 % (ref 37.5–51.0)
Hemoglobin: 16.6 g/dL (ref 13.0–17.7)
MCH: 29.6 pg (ref 26.6–33.0)
MCHC: 33.4 g/dL (ref 31.5–35.7)
MCV: 89 fL (ref 79–97)
Platelets: 316 x10E3/uL (ref 150–450)
RBC: 5.6 x10E6/uL (ref 4.14–5.80)
RDW: 13 % (ref 11.6–15.4)
WBC: 7.6 x10E3/uL (ref 3.4–10.8)

## 2023-11-01 LAB — FSH/LH
FSH: 4.7 m[IU]/mL (ref 1.5–12.4)
LH: 5.4 m[IU]/mL (ref 1.7–8.6)

## 2023-11-01 LAB — TESTT+TESTF+SHBG
Sex Hormone Binding: 17.2 nmol/L (ref 16.5–55.9)
Testosterone, Free: 21 pg/mL (ref 9.3–26.5)
Testosterone, Total, LC/MS: 642.1 ng/dL (ref 264.0–916.0)

## 2023-11-01 LAB — PROLACTIN: Prolactin: 42.8 ng/mL — ABNORMAL HIGH (ref 3.6–31.5)

## 2023-11-04 NOTE — Telephone Encounter (Signed)
 Please review and advise patient. Thank you  JM

## 2023-11-06 LAB — PROLACTIN: Prolactin: 20.7 ng/mL (ref 3.6–31.5)

## 2023-11-07 ENCOUNTER — Other Ambulatory Visit: Payer: Self-pay

## 2023-11-07 DIAGNOSIS — N529 Male erectile dysfunction, unspecified: Secondary | ICD-10-CM

## 2023-11-11 ENCOUNTER — Telehealth: Payer: Self-pay

## 2023-11-11 NOTE — Telephone Encounter (Signed)
 Please review and advise.  JM

## 2023-11-11 NOTE — Telephone Encounter (Signed)
 Copied from CRM [id number]. Topic: Referral - Question >> Nov 11, 2023 11:06 AM Myrick T wrote: Reason for CRM: Tomeka from [facility] called as provider wants to know the reason for the referral being the repeat prolactin from 10/7 was normal. Please reach out to office at [phone]

## 2023-11-14 NOTE — Telephone Encounter (Signed)
 Spoke with Hobert and informed her patient requested referral. She will inform the doctor and get referral pushed through.  JM

## 2023-11-26 ENCOUNTER — Ambulatory Visit (INDEPENDENT_AMBULATORY_CARE_PROVIDER_SITE_OTHER): Admitting: Physician Assistant

## 2023-11-26 VITALS — BP 149/74 | HR 73 | Ht 67.0 in | Wt 150.0 lb

## 2023-11-26 DIAGNOSIS — N529 Male erectile dysfunction, unspecified: Secondary | ICD-10-CM | POA: Diagnosis not present

## 2023-11-26 DIAGNOSIS — R7989 Other specified abnormal findings of blood chemistry: Secondary | ICD-10-CM

## 2023-11-26 MED ORDER — TADALAFIL 20 MG PO TABS: ORAL_TABLET | ORAL | 5 refills | Status: AC

## 2023-11-26 NOTE — Patient Instructions (Signed)
 Cole Jordan
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
## 2023-11-27 ENCOUNTER — Ambulatory Visit: Payer: Self-pay | Admitting: Physician Assistant

## 2023-11-27 ENCOUNTER — Other Ambulatory Visit: Payer: Self-pay

## 2023-11-27 DIAGNOSIS — R7989 Other specified abnormal findings of blood chemistry: Secondary | ICD-10-CM

## 2023-11-27 LAB — TESTOSTERONE: Testosterone: 253 ng/dL — ABNORMAL LOW (ref 264–916)

## 2023-11-28 ENCOUNTER — Other Ambulatory Visit

## 2023-11-29 ENCOUNTER — Other Ambulatory Visit

## 2023-11-29 DIAGNOSIS — R7989 Other specified abnormal findings of blood chemistry: Secondary | ICD-10-CM

## 2023-11-30 LAB — FSH/LH
FSH: 3.8 m[IU]/mL (ref 1.5–12.4)
LH: 4.2 m[IU]/mL (ref 1.7–8.6)

## 2023-11-30 LAB — TESTOSTERONE: Testosterone: 313 ng/dL (ref 264–916)

## 2023-11-30 LAB — PROLACTIN: Prolactin: 20.9 ng/mL (ref 3.6–31.5)

## 2023-12-03 ENCOUNTER — Other Ambulatory Visit

## 2023-12-13 ENCOUNTER — Ambulatory Visit: Payer: Self-pay | Admitting: Physician Assistant

## 2023-12-25 ENCOUNTER — Ambulatory Visit: Admitting: Physician Assistant

## 2023-12-25 VITALS — BP 143/81 | HR 101 | Ht 67.0 in | Wt 140.0 lb

## 2023-12-25 DIAGNOSIS — E291 Testicular hypofunction: Secondary | ICD-10-CM | POA: Diagnosis not present

## 2023-12-25 DIAGNOSIS — N529 Male erectile dysfunction, unspecified: Secondary | ICD-10-CM

## 2023-12-25 MED ORDER — AMBULATORY NON FORMULARY MEDICATION: 1 refills | Status: AC
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
## 2023-12-25 NOTE — Patient Instructions (Addendum)
 When you call to make lab appointment make sure you specify that you have to have an 8 am, per Sam. [phone]     I sent your enclomiphene prescription to:  [facility] [address] [address] [place], [place] [address] Phone: [phone] Fax: [phone]

## 2024-01-01 ENCOUNTER — Telehealth: Payer: Self-pay

## 2024-01-01 NOTE — Telephone Encounter (Signed)
 1 capsule by mouth daily. I called the pharmacy and gave verbal.

## 2024-01-01 NOTE — Telephone Encounter (Signed)
 Janae from [facility] [employer] LM on triage line-   Received a rx for Enclomiphene citrate capsules 25mg   w/o directions.   Pls contact pharmacy with directions at [phone] or pls send updated rx.  Ref# [id number].

## 2024-01-07 ENCOUNTER — Ambulatory Visit: Admitting: Physician Assistant
